# Patient Record
Sex: Male | Born: 2005 | Race: White | Hispanic: Yes | Marital: Single | Smoking: Never smoker
Health system: Southern US, Community
[De-identification: ages and names within clinical notes are randomized; demographics above are authoritative.]

## PROBLEM LIST (undated history)

## (undated) DIAGNOSIS — J45909 Unspecified asthma, uncomplicated: Secondary | ICD-10-CM

---
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
## 2023-03-01 ENCOUNTER — Ambulatory Visit (INDEPENDENT_AMBULATORY_CARE_PROVIDER_SITE_OTHER): Payer: Medicaid Other | Admitting: Pediatrics

## 2023-03-01 ENCOUNTER — Encounter: Payer: Self-pay | Admitting: Pediatrics

## 2023-03-01 VITALS — BP 122/74 | HR 84 | Ht 70.87 in | Wt 226.0 lb

## 2023-03-01 DIAGNOSIS — Z0000 Encounter for general adult medical examination without abnormal findings: Secondary | ICD-10-CM

## 2023-03-01 DIAGNOSIS — E781 Pure hyperglyceridemia: Secondary | ICD-10-CM | POA: Diagnosis not present

## 2023-03-01 DIAGNOSIS — L700 Acne vulgaris: Secondary | ICD-10-CM

## 2023-03-01 DIAGNOSIS — J301 Allergic rhinitis due to pollen: Secondary | ICD-10-CM

## 2023-03-01 DIAGNOSIS — E559 Vitamin D deficiency, unspecified: Secondary | ICD-10-CM

## 2023-03-01 DIAGNOSIS — Z1331 Encounter for screening for depression: Secondary | ICD-10-CM

## 2023-03-01 MED ORDER — CLINDAMYCIN PHOS-BENZOYL PEROX 1.2-5 % EX GEL: 1.0000 | CUTANEOUS | 5 refills | Status: AC

## 2023-03-01 MED ORDER — CETIRIZINE HCL 10 MG PO TABS: 10.0000 mg | ORAL_TABLET | Freq: Every day | ORAL | 11 refills | Status: AC

## 2023-03-01 NOTE — Patient Instructions (Signed)
Cancer Prevention Information, Teen There is no guaranteed way to prevent all cancers, but there are many steps that you can take to lower your risk. Making healthy food choices, getting regular exercise, and having a healthy lifestyle are all ways that can help to reduce your risk. What can increase my risk of developing cancer? Risk factors for developing cancer as a teen include: Having had chemotherapy or radiation treatments for childhood cancer. Being exposed over time to ultraviolet (UV) rays from the sun or other types of radiation. Having a weakened body's defense system (immune system). Your immune system can be weakened by certain conditions, such as HIV infection, or taking certain medicines. Using tobacco products, including smoking or chewing tobacco. Other risk factors have a long-term effect and can make you more likely to develop cancer as an adult: Having a family history of cancer. Eating an unhealthy diet. Being obese. Drinking alcohol. Having certain infections, such as human papillomavirus (HPV). Being exposed to certain chemicals or environmental poisons (toxins). Having a condition that causes long-term (chronic) inflammation, such as inflammatory bowel disease. What actions can I take to prevent cancer? Nutrition  Eat a healthy, plant-based diet that includes plenty of fresh fruits and vegetables. Try to eat 1?2 cups of fruit each day and 2?3 cups of vegetables each day. One way to move toward a plant-based diet is to plan to eat one vegetarian meal each week. Then try to work up to two vegetarian meals, if possible. Eat whole grains instead of refined or processed grains. Cut down on the amount of red meat and processed meat that you eat. To help cut down on red meat, try eating seafood two or more times a week. Limit the amount of charred and smoked meat that you eat. Eat portions that help you stay at a healthy weight. Do not drink alcohol. Lifestyle Get  vaccines to help prevent conditions that can eventually lead to cancer, such as hepatitis and HPV. Ask your health care provider about which vaccines you should get. Limit sun exposure. If you are out in the sun, use sunscreen and cover up with hats, clothing, and sunglasses. Use a sunscreen with a sun protection factor (SPF) of at least 15. Use an SPF of 30 or higher if you are in bright sun, especially when you are out in the snow or on the water. Do not use tanning beds or sun lamps. Do not use any products that contain nicotine or tobacco. These products include cigarettes, chewing tobacco, and vaping devices, such as e-cigarettes. If you need help quitting, ask your health care provider. Avoid exposure to harmful substances such as asbestos, silica, solvents, or radon. Wear a protective mask if you must work near harmful substances. Ask your parents if your home has been checked for radon. If you are sexually active, practice safe sex. Use a protective barrier, such as a condom, every time you have sex. Limit your number of sex partners. Activity  Exercise and stay physically active every day. You should aim to get at least 60 minutes of moderate physical activity each day. Limit activities that are not active (are sedentary), such as watching TV or playing video games. Know your family history If there is any history of cancer in your family, talk with your health care provider about it. Depending on your family history of cancer, your health care provider may recommend genetic counseling with testing once you turn 18 years old to determine whether you may be at higher   risk for developing certain types of cancer. Results from these tests can help in making decisions about future medical care and steps for prevention. Why are these actions important? Tobacco use is the leading cause of cancer and death from cancer. Alcohol has been linked to an increased risk of cancers. Lack of physical  activity and an unhealthy diet have been linked to cancer in the [place]. Obesity has been linked to an increased risk of developing cancers of the breast, colon, rectum, esophagus, kidney, pancreas, and gallbladder. Exposure to UV radiation can cause sunburns and skin damage that can lead to skin cancer. HPV is associated with several types of cancer. Where to find more information National Cancer Institute: cancer.gov Cancer Trends Progress Report: progressreport.cancer.gov American Cancer Society: cancer.org Contact a health care provider if: You want to discuss healthy ways to improve your diet and lifestyle. You would like to learn more about quitting smoking or tobacco use. You want to discuss your family history of cancer. This information is not intended to replace advice given to you by your health care provider. Make sure you discuss any questions you have with your health care provider. Document Revised: 03/15/2022 Document Reviewed: 03/15/2022 Elsevier Patient Education  2024 Elsevier Inc.  

## 2023-03-01 NOTE — Progress Notes (Signed)
Patient Name:  ALEXIE KOHLBECK Date of Birth:  Apr 13, 2006 Age:  17 y.o. Date of Visit:  03/01/2023    SUBJECTIVE:     Interval Histories:  Chief Complaint  Patient presents with   Well Child    Accompanied by: mom [name]    CONCERNS: none   DEVELOPMENT:    Grade Level in School:  12th grade     School Performance:  well    Aspirations:  unknown       Medical illustrator Activities: fishing      He does chores around the house.    WORK: none; will be looking for a job     DRIVING:  not yet   MENTAL HEALTH:     06/11/2021    9:20 AM 02/16/2022    3:19 PM 03/01/2023    2:48 PM  PHQ-Adolescent  Down, depressed, hopeless 0 0 0  Decreased interest 0 0 0  Altered sleeping 0 0 0  Change in appetite 0 0 0  Tired, decreased energy 0 0 0  Feeling bad or failure about yourself 0 0 0  Trouble concentrating 0 0 0  Moving slowly or fidgety/restless 0 0 0  Suicidal thoughts 0 0 0  PHQ-Adolescent Score 0 0 0  In the past year have you felt depressed or sad most days, even if you felt okay sometimes? No No No  If you are experiencing any of the problems on this form, how difficult have these problems made it for you to do your work, take care of things at home or get along with other people? Not difficult at all Not difficult at all Not difficult at all  Has there been a time in the past month when you have had serious thoughts about ending your own life? No No No  Have you ever, in your whole life, tried to kill yourself or made a suicide attempt? No No No         Minimal Depression <5. Mild Depression 5-9. Moderate Depression 10-14. Moderately Severe Depression 15-19. Severe >20  NUTRITION:       Fluid intake: water mostly, some soda      Diet:  Eats fruits, vegetables, meats      Eats breakfast? None   ELIMINATION:  Voids multiple times a day                           Regular stools   EXERCISE:  weight lifting in school   SAFETY:  He wears seat belt all the time. He feels  safe at home.  He feels safe at school.    Social History   Tobacco Use   Smoking status: Never    Passive exposure: Never   Smokeless tobacco: Never  Vaping Use   Vaping Use: Never used  Substance Use Topics   Alcohol use: Never   Drug use: Never    Vaping/E-Liquid Use   Vaping Use Never User    Social History   Substance and Sexual Activity  Sexual Activity Never     Past Histories: Past Medical History:  Diagnosis Date   Allergic rhinitis 12/2010   Hypertriglyceridemia 08/09/2016   Low HDL (under 40) 08/09/2016   Migraine 06/2015   Mild intermittent asthma 02/2012   NAFLD (nonalcoholic fatty liver disease) 16/07/9603   [facility]   Pediatric obesity due to excess calories without serious comorbidity 01/10/2017   Urticaria 06/2010  Vitamin D deficiency 12/2017    Family History  Problem Relation Age of Onset   Diabetes Paternal Grandmother    Diabetes Maternal Aunt    Diabetes Maternal Uncle    Hypertension Maternal Grandmother    Heart disease Maternal Grandmother    Diabetes Paternal Grandfather     Allergies  Allergen Reactions   Pollen Extract    Amoxil [Amoxicillin Trihydrate] Rash   Bee Pollen Itching   Outpatient Medications Prior to Visit  Medication Sig Dispense Refill   cetirizine (ZYRTEC) 10 MG tablet TAKE 1 TABLET BY MOUTH EVERY DAY 30 tablet 11   Vitamin D, Ergocalciferol, (DRISDOL) 1.25 MG (50000 UNIT) CAPS capsule Take 1 capsule (50,000 Units total) by mouth every 7 (seven) days. 13 capsule 0   Cholecalciferol 1.25 MG (50000 UT) capsule Take 1 capsule by mouth once a week.     ibuprofen (ADVIL) 800 MG tablet Take 800 mg by mouth every 8 (eight) hours as needed.     No facility-administered medications prior to visit.       Review of Systems  Constitutional:  Negative for activity change, chills and diaphoresis.  HENT:  Negative for congestion, hearing loss, rhinorrhea, tinnitus and voice change.   Respiratory:  Negative  for cough and shortness of breath.   Cardiovascular:  Negative for chest pain and leg swelling.  Gastrointestinal:  Negative for abdominal distention and blood in stool.  Genitourinary:  Negative for decreased urine volume and dysuria.  Musculoskeletal:  Negative for joint swelling, myalgias and neck pain.  Skin:  Negative for rash.  Neurological:  Negative for tremors, facial asymmetry and weakness.     OBJECTIVE:  VITALS:  BP 122/74   Pulse 84   Ht 5' 10.87" (1.8 m)   Wt 226 lb (102.5 kg)   SpO2 99%   BMI 31.64 kg/m   Body mass index is 31.64 kg/m.   96 %ile (Z= 1.79) based on CDC (Boys, 2-20 Years) BMI-for-age based on BMI available as of 03/01/2023. Hearing Screening   500Hz  1000Hz  2000Hz  3000Hz  4000Hz  6000Hz  8000Hz   Right ear 20 20 20 20 20 20 20   Left ear 20 20 20 20  02 20 20   Vision Screening   Right eye Left eye Both eyes  Without correction 20/200 20/200 20/200  With correction     Comments: For got glasses    PHYSICAL EXAM: GEN:  Alert, active, no acute distress HEENT:  Normocephalic.           Pupils 2-4 mm, equally round and reactive to light.           Extraoccular muscles intact.           Tympanic membranes are pearly gray bilaterally.            Turbinates:  normal          Tongue midline. No pharyngeal lesions.   NECK:  Supple. Full range of motion.  No thyromegaly.  No lymphadenopathy.  No carotid bruit. CARDIOVASCULAR:  Normal S1, S2.  No gallops or clicks.  No murmurs.   LUNGS:  Normal shape.  Clear to auscultation.  ABDOMEN:  Normoactive polyphonic bowel sounds.  No masses.  No hepatosplenomegaly EXTREMITIES:  No clubbing.  No cyanosis.  No edema. SKIN:  Well perfused.  No rash NEURO:  Normal muscle strength.  CN II-XI intact.  Normal gait cycle.  +2/4 Deep tendon reflexes.   SPINE:  No deformities.  No scoliosis.    ASSESSMENT/PLAN:  Khalifa is a 17 y.o. teen who is growing and developing well. School Form given:  none  Anticipatory Guidance      - Handout:  Preventing Cancer      - Discussed growth, diet, and exercise.    - Discussed dangers of substance use.    - Discussed lifelong adult responsibility of pregnancy and dangers of STDs.  Discussed safe sex practices including abstinence.     - Taught self-testicular exam.     Reviewed and discussed PHQ9-A.  IMMUNIZATIONS:   OTHER PROBLEMS ADDRESSED THIS VISIT: 1. Seasonal allergic rhinitis due to pollen Refills provided. - cetirizine (ZYRTEC) 10 MG tablet; Take 1 tablet (10 mg total) by mouth daily.  Dispense: 30 tablet; Refill: 11  2. Acne vulgaris Refills provided.  - Clindamycin-Benzoyl Per, Refr, gel; Apply 1 Application topically 3 (three) times a week. On Wednesday, Friday, and Sunday mornings.  Dispense: 45 g; Refill: 5  3. Hypertriglyceridemia  - Lipid panel - Hemoglobin A1c  5. Vitamin D deficiency  - VITAMIN D 25 Hydroxy (Vit-D Deficiency, Fractures)     Return if symptoms worsen or fail to improve.

## 2023-03-01 NOTE — Progress Notes (Deleted)
Patient Name:  Matthew Marshall Date of Birth:  2006-02-20 Age:  17 y.o. Date of Visit:  03/01/2023    SUBJECTIVE:     Interval Histories:  Chief Complaint  Patient presents with   Well Child    Accompanied by: mom Matthew Marshall    CONCERNS: ***  DEVELOPMENT:    Grade Level in School:  ***    School Performance:  ***    Aspirations:  ***    Extracurricular Activities: ***     He does chores around the house.    WORK: none ***    DRIVING:  not yet ***  MENTAL HEALTH:     06/11/2021    9:20 AM 02/16/2022    3:19 PM 03/01/2023    2:48 PM  PHQ-Adolescent  Down, depressed, hopeless 0 0 0  Decreased interest 0 0 0  Altered sleeping 0 0 0  Change in appetite 0 0 0  Tired, decreased energy 0 0 0  Feeling bad or failure about yourself 0 0 0  Trouble concentrating 0 0 0  Moving slowly or fidgety/restless 0 0 0  Suicidal thoughts 0 0 0  PHQ-Adolescent Score 0 0 0  In the past year have you felt depressed or sad most days, even if you felt okay sometimes? No No No  If you are experiencing any of the problems on this form, how difficult have these problems made it for you to do your work, take care of things at home or get along with other people? Not difficult at all Not difficult at all Not difficult at all  Has there been a time in the past month when you have had serious thoughts about ending your own life? No No No  Have you ever, in your whole life, tried to kill yourself or made a suicide attempt? No No No         Minimal Depression <5. Mild Depression 5-9. Moderate Depression 10-14. Moderately Severe Depression 15-19. Severe >20  NUTRITION:       Fluid intake: ***    Diet:  Eats *** fruits, *** vegetables, meats, *** seafood    Eats breakfast? ***  ELIMINATION:  Voids multiple times a day                           Regular stools   EXERCISE:  ***  SAFETY:  He wears seat belt all the time. He feels safe at home.  He feels safe at school.   MENSTRUAL HISTORY:       Menarche:  ***    Cycle:  regular ***     Flow:  ***    Other Symptoms: ***   Social History   Tobacco Use   Smoking status: Never    Passive exposure: Never   Smokeless tobacco: Never  Vaping Use   Vaping Use: Never used  Substance Use Topics   Alcohol use: Never   Drug use: Never    Vaping/E-Liquid Use   Vaping Use Never User    Social History   Substance and Sexual Activity  Sexual Activity Never     Past Histories: Past Medical History:  Diagnosis Date   Allergic rhinitis 12/2010   Hypertriglyceridemia 08/09/2016   Low HDL (under 40) 08/09/2016   Migraine 06/2015   Mild intermittent asthma 02/2012   NAFLD (nonalcoholic fatty liver disease) 16/07/9603   [facility]   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   Urticaria 06/2010   Vitamin D deficiency 12/2017    Family History  Problem Relation Age of Onset   Diabetes Paternal Grandmother    Diabetes Maternal Aunt    Diabetes Maternal Uncle    Hypertension Maternal Grandmother    Heart disease Maternal Grandmother    Diabetes Paternal Grandfather     Allergies  Allergen Reactions   Pollen Extract    Amoxil [Amoxicillin Trihydrate] Rash   Bee Pollen Itching   Outpatient Medications Prior to Visit  Medication Sig Dispense Refill   cetirizine (ZYRTEC) 10 MG tablet TAKE 1 TABLET BY MOUTH EVERY DAY 30 tablet 11   Cholecalciferol 1.25 MG (50000 UT) capsule Take 1 capsule by mouth once a week.     ibuprofen (ADVIL) 800 MG tablet Take 800 mg by mouth every 8 (eight) hours as needed.     Vitamin D, Ergocalciferol, (DRISDOL) 1.25 MG (50000 UNIT) CAPS capsule Take 1 capsule (50,000 Units total) by mouth every 7 (seven) days. 13 capsule 0   No facility-administered medications prior to visit.       Review of Systems   OBJECTIVE:  VITALS:  BP 122/74   Pulse 84   Ht 5' 10.87" (1.8 m)   Wt 226 lb (102.5 kg)   SpO2 99%   BMI 31.64 kg/m   Body mass index is  31.64 kg/m.   96 %ile (Z= 1.79) based on CDC (Boys, 2-20 Years) BMI-for-age based on BMI available as of 03/01/2023. Hearing Screening   500Hz  1000Hz  2000Hz  3000Hz  4000Hz  6000Hz  8000Hz   Right ear 20 20 20 20 20 20 20   Left ear 20 20 20 20  02 20 20   Vision Screening   Right eye Left eye Both eyes  Without correction 20/200 20/200 20/200  With correction     Comments: For got glasses    PHYSICAL EXAM: GEN:  Alert, active, no acute distress HEENT:  Normocephalic.           Pupils 2-4 mm, equally round and reactive to light.           Extraoccular muscles intact.           Tympanic membranes are pearly gray bilaterally.            Turbinates:  normal          Tongue midline. No pharyngeal lesions.   NECK:  Supple. Full range of motion.  No thyromegaly.  No lymphadenopathy.  No carotid bruit. CARDIOVASCULAR:  Normal S1, S2.  No gallops or clicks.  No murmurs.   LUNGS:  Normal shape.  Clear to auscultation.   CHEST:  Breast SMR *** ABDOMEN:  Normoactive polyphonic bowel sounds.  No masses.  No hepatosplenomegaly. EXTERNAL GENITALIA:  Normal SMR *** EXTREMITIES:  No clubbing.  No cyanosis.  No edema. SKIN:  Well perfused.  No rash NEURO:  Normal muscle strength.  CN II-XI intact.  Normal gait cycle.  +2/4 Deep tendon reflexes.   SPINE:  No deformities.  No scoliosis.    ASSESSMENT/PLAN:   Matthew Marshall is a 17 y.o. teen who is growing and developing well. School Form given:  *** Anticipatory Guidance     - Handout:       - Discussed growth, diet, and exercise.    - Discussed dangers of substance use.    - Discussed lifelong adult responsibility of pregnancy and dangers of STDs.  Discussed safe sex practices including abstinence.     -  Taught self-breast exam. *** Taught self-testicular exam.     Reviewed and discussed PHQ9-A.  IMMUNIZATIONS:  Handout (VIS) provided for each vaccine for the parent to review during this visit. Vaccines were discussed and questions were answered.  Parent  verbally expressed understanding.  Parent *** to the administration of vaccine/vaccines as ordered today.  No orders of the defined types were placed in this encounter.      OTHER PROBLEMS ADDRESSED THIS VISIT: ***    No follow-ups on file.

## 2023-04-15 LAB — VITAMIN D 25 HYDROXY (VIT D DEFICIENCY, FRACTURES): Vit D, 25-Hydroxy: 18.3 ng/mL — ABNORMAL LOW (ref 30.0–100.0)

## 2023-04-15 LAB — HEMOGLOBIN A1C
Est. average glucose Bld gHb Est-mCnc: 114 mg/dL
Hgb A1c MFr Bld: 5.6 % (ref 4.8–5.6)

## 2023-04-15 LAB — LIPID PANEL
Chol/HDL Ratio: 5.9 ratio — ABNORMAL HIGH (ref 0.0–5.0)
Cholesterol, Total: 164 mg/dL (ref 100–169)
HDL: 28 mg/dL — ABNORMAL LOW (ref >39)
LDL Chol Calc (NIH): 86 mg/dL (ref 0–109)
Triglycerides: 300 mg/dL — ABNORMAL HIGH (ref 0–89)
VLDL Cholesterol Cal: 50 mg/dL — ABNORMAL HIGH (ref 5–40)

## 2023-04-20 ENCOUNTER — Encounter: Payer: Self-pay | Admitting: Pediatrics

## 2023-05-03 ENCOUNTER — Telehealth: Payer: Self-pay

## 2023-05-03 NOTE — Telephone Encounter (Signed)
Mom called to get lab results. However, I told mom that we would need to speak to [facility]. She asked for him to be called back. Londyn's # is [phone].

## 2023-05-03 NOTE — Telephone Encounter (Signed)
I wrote him a letter.  He should be getting the letter and the actual results in the mail.

## 2023-05-27 DIAGNOSIS — H5213 Myopia, bilateral: Secondary | ICD-10-CM | POA: Diagnosis not present
